# Patient Record
Sex: Male | Born: 2000 | Hispanic: Yes | Marital: Single | Smoking: Never smoker
Health system: Southern US, Community
[De-identification: ages and names within clinical notes are randomized; demographics above are authoritative.]

---
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
## 2020-10-10 ENCOUNTER — Encounter (HOSPITAL_BASED_OUTPATIENT_CLINIC_OR_DEPARTMENT_OTHER): Payer: Self-pay | Admitting: Emergency Medicine

## 2020-10-10 ENCOUNTER — Emergency Department (HOSPITAL_BASED_OUTPATIENT_CLINIC_OR_DEPARTMENT_OTHER)
Admission: EM | Admit: 2020-10-10 | Discharge: 2020-10-10 | Disposition: A | Discharge status: Home / Self Care | Payer: Medicaid Other | Attending: Emergency Medicine | Admitting: Emergency Medicine

## 2020-10-10 ENCOUNTER — Other Ambulatory Visit: Payer: Self-pay

## 2020-10-10 ENCOUNTER — Emergency Department (HOSPITAL_BASED_OUTPATIENT_CLINIC_OR_DEPARTMENT_OTHER): Payer: Medicaid Other

## 2020-10-10 DIAGNOSIS — R0602 Shortness of breath: Secondary | ICD-10-CM | POA: Diagnosis not present

## 2020-10-10 DIAGNOSIS — R198 Other specified symptoms and signs involving the digestive system and abdomen: Secondary | ICD-10-CM

## 2020-10-10 DIAGNOSIS — R0989 Other specified symptoms and signs involving the circulatory and respiratory systems: Secondary | ICD-10-CM | POA: Diagnosis not present

## 2020-10-10 DIAGNOSIS — F410 Panic disorder [episodic paroxysmal anxiety] without agoraphobia: Secondary | ICD-10-CM

## 2020-10-10 DIAGNOSIS — F419 Anxiety disorder, unspecified: Secondary | ICD-10-CM | POA: Insufficient documentation

## 2020-10-10 LAB — BASIC METABOLIC PANEL
Anion gap: 10 (ref 5–15)
BUN: 14 mg/dL (ref 6–20)
CO2: 24 mmol/L (ref 22–32)
Calcium: 9.4 mg/dL (ref 8.9–10.3)
Chloride: 104 mmol/L (ref 98–111)
Creatinine, Ser: 1.11 mg/dL (ref 0.61–1.24)
GFR, Estimated: >60 mL/min (ref >60)
Glucose, Bld: 110 mg/dL — ABNORMAL HIGH (ref 70–99)
Potassium: 3.8 mmol/L (ref 3.5–5.1)
Sodium: 138 mmol/L (ref 135–145)

## 2020-10-10 LAB — CBC
HCT: 42 % (ref 39.0–52.0)
Hemoglobin: 14.4 g/dL (ref 13.0–17.0)
MCH: 30.8 pg (ref 26.0–34.0)
MCHC: 34.3 g/dL (ref 30.0–36.0)
MCV: 89.9 fL (ref 80.0–100.0)
Platelets: 393 10*3/uL (ref 150–400)
RBC: 4.67 MIL/uL (ref 4.22–5.81)
RDW: 12 % (ref 11.5–15.5)
WBC: 10.5 10*3/uL (ref 4.0–10.5)
nRBC: 0 % (ref 0.0–0.2)

## 2020-10-10 LAB — TSH: TSH: 2.309 u[IU]/mL (ref 0.350–4.500)

## 2020-10-10 MED ORDER — OMEPRAZOLE 20 MG PO CPDR: 20.0000 mg | DELAYED_RELEASE_CAPSULE | Freq: Every day | ORAL | 0 refills | Status: AC

## 2020-10-10 NOTE — Discharge Instructions (Signed)
Take the medications as prescribed in case some of your symptoms are being triggered by acid reflux.  Follow-up with an ENT doctor as we discussed.  Call the office to schedule an appointment.  Follow-up with your primary care doctor

## 2020-10-10 NOTE — ED Triage Notes (Signed)
Pt thought he had COVID 2 weeks ago and tested negative. Pt called EMS for SOB. States since he was sick he hasnt been able to get a full breath. Vitals for EMS 78 HR, 99% room air, 98.2 temp, 162/98, CBG 107. Complains of coughing every time he drinks anything. EMS patient is very anxious. Pt crying in triage. States he feels like its hard to swallow.

## 2020-10-10 NOTE — ED Provider Notes (Signed)
[facility] EMERGENCY DEPARTMENT Provider Note   CSN: [id number] Arrival date & time: 10/10/20  1257     History Chief Complaint  Patient presents with  . Panic Attack    Eric Pitts is a 19 y.o. male.  HPI   Patient presents to the ED for evaluation of trouble with intermittent dyspnea.  Patient states the symptoms have been going on for at least a couple months.  He is having episodes at least daily.  Patient gets a sensation that his throat is starting to close up.  This will make him feel anxious and he becomes short of breath.  He occasionally is coughing when he tries to drink.  Patient states he feels like he started to have a panic attack.  He gets numbness and tingling all over.  Patient has not seen anyone since that started.  Today the episode was very severe.  When EMS arrived he was anxious and crying.  He has feels like the symptoms have resolved at this point.  History reviewed. No pertinent past medical history.  Patient Active Problem List   Diagnosis Date Noted  . Ingrown nail 09/13/2014  . Pain in lower limb 09/13/2014    History reviewed. No pertinent surgical history.     No family history on file.  Social History   Tobacco Use  . Smoking status: Never Smoker  . Smokeless tobacco: Never Used  Substance Use Topics  . Alcohol use: No  . Drug use: No    Home Medications Prior to Admission medications   Medication Sig Start Date End Date Taking? Authorizing Provider  ibuprofen (ADVIL,MOTRIN) 600 MG tablet Take 1 tablet (600 mg total) by mouth every 6 (six) hours as needed. 02/25/18   Renne Crigler, PA-C  omeprazole (PRILOSEC) 20 MG capsule Take 1 capsule (20 mg total) by mouth daily. 10/10/20   Linwood Dibbles, MD    Allergies    Patient has no known allergies.  Review of Systems   Review of Systems  All other systems reviewed and are negative.   Physical Exam Updated Vital Signs BP (!) 143/80 (BP Location: Left Arm)   Pulse (!) 55    Temp 99 F (37.2 C) (Oral)   Resp 14   Ht 1.753 m (5\' 9" )   Wt 79.8 kg   SpO2 100%   BMI 25.99 kg/m   Physical Exam Vitals and nursing note reviewed.  Constitutional:      General: He is not in acute distress.    Appearance: He is well-developed.  HENT:     Head: Normocephalic and atraumatic.     Right Ear: External ear normal.     Left Ear: External ear normal.     Mouth/Throat:     Comments: Tonsillar hypertrophy noted, primarily on the right side, no uvula edema, no oropharyngeal edema Eyes:     General: No scleral icterus.       Right eye: No discharge.        Left eye: No discharge.     Conjunctiva/sclera: Conjunctivae normal.  Neck:     Trachea: No tracheal deviation.  Cardiovascular:     Rate and Rhythm: Normal rate and regular rhythm.  Pulmonary:     Effort: Pulmonary effort is normal. No respiratory distress.     Breath sounds: Normal breath sounds. No stridor. No wheezing or rales.  Abdominal:     General: Bowel sounds are normal. There is no distension.     Palpations: Abdomen is  soft.     Tenderness: There is no abdominal tenderness. There is no guarding or rebound.  Musculoskeletal:        General: No tenderness.     Cervical back: Neck supple.  Skin:    General: Skin is warm and dry.     Findings: No rash.  Neurological:     Mental Status: He is alert.     Cranial Nerves: No cranial nerve deficit (no facial droop, extraocular movements intact, no slurred speech).     Sensory: No sensory deficit.     Motor: No abnormal muscle tone or seizure activity.     Coordination: Coordination normal.     ED Results / Procedures / Treatments   Labs (all labs ordered are listed, but only abnormal results are displayed) Labs Reviewed  BASIC METABOLIC PANEL - Abnormal; Notable for the following components:      Result Value   Glucose, Bld 110 (*)    All other components within normal limits  CBC  TSH    EKG EKG Interpretation  Date/Time:  Friday  October 10 2020 15:52:27 EST Ventricular Rate:  60 PR Interval:  148 QRS Duration: 96 QT Interval:  414 QTC Calculation: 414 R Axis:   57 Text Interpretation: Normal sinus rhythm Normal ECG No old tracing to compare Confirmed by Linwood Dibbles [phone]) on 10/10/2020 4:03:23 PM   Radiology DG Chest 2 View  Result Date: 10/10/2020 CLINICAL DATA:  19 year old male with intermittent shortness of breath. Coughing with p.o. Anxious. EXAM: CHEST - 2 VIEW COMPARISON:  None. FINDINGS: Normal lung volumes. Normal cardiac size and mediastinal contours. Visualized tracheal air column is within normal limits. Both lungs are clear. No pneumothorax or pleural effusion. No osseous abnormality identified. Negative visible bowel gas pattern. IMPRESSION: Negative.  No cardiopulmonary abnormality identified. Electronically Signed   By: Odessa Fleming M.D.   On: 10/10/2020 16:07    Procedures Procedures (including critical care time)  Medications Ordered in ED Medications - No data to display  ED Course  I have reviewed the triage vital signs and the nursing notes.  Pertinent labs & imaging results that were available during my care of the patient were reviewed by me and considered in my medical decision making (see chart for details).  Clinical Course as of Oct 10 1748  Fri Oct 10, 2020  1718 Chest x-ray normal.   [JK]  1718 Laboratory tests normal   [JK]    Clinical Course User Index [JK] Linwood Dibbles, MD   MDM Rules/Calculators/A&P                          Patient presented to the ED for evaluation of a sensation of something stuck in his throat as well as shortness of breath.  Patient does not have any abnormalities notable on exam.  He does have some tonsillar hypertrophy but it is not causing any issues with his breathing at rest.  He does not have stridor.  No difficulty with secretions.  I do not see any abnormalities on exam.  Doubt obstructing mass.  Doubt PE or acute cardiac disease.  Does sound  like the patient had a panic attack and paresthesias associated with hyperventilation.  All the symptoms resolved in the ED.  Patient's work-up is unremarkable.  X-ray is normal.  Labs are normal.  Do think it is reasonable have the patient follow-up with an ENT doctor.  We will start him on antacids as  some of this could be triggered by acid reflux.  Recommend follow-up with a primary care doctor. Final Clinical Impression(s) / ED Diagnoses Final diagnoses:  Anxiety attack  Globus sensation    Rx / DC Orders ED Discharge Orders         Ordered    omeprazole (PRILOSEC) 20 MG capsule  Daily        10/10/20 1749           Linwood Dibbles, MD 10/10/20 1751

## 2022-07-06 IMAGING — CR DG CHEST 2V
2 series · 2 of 2 positions shown · non-contrast
Comparison: None.

CLINICAL DATA: 19-year-old male with intermittent shortness of
breath. Coughing with p.o. Anxious.

EXAM:
CHEST - 2 VIEW

[w chest pa]
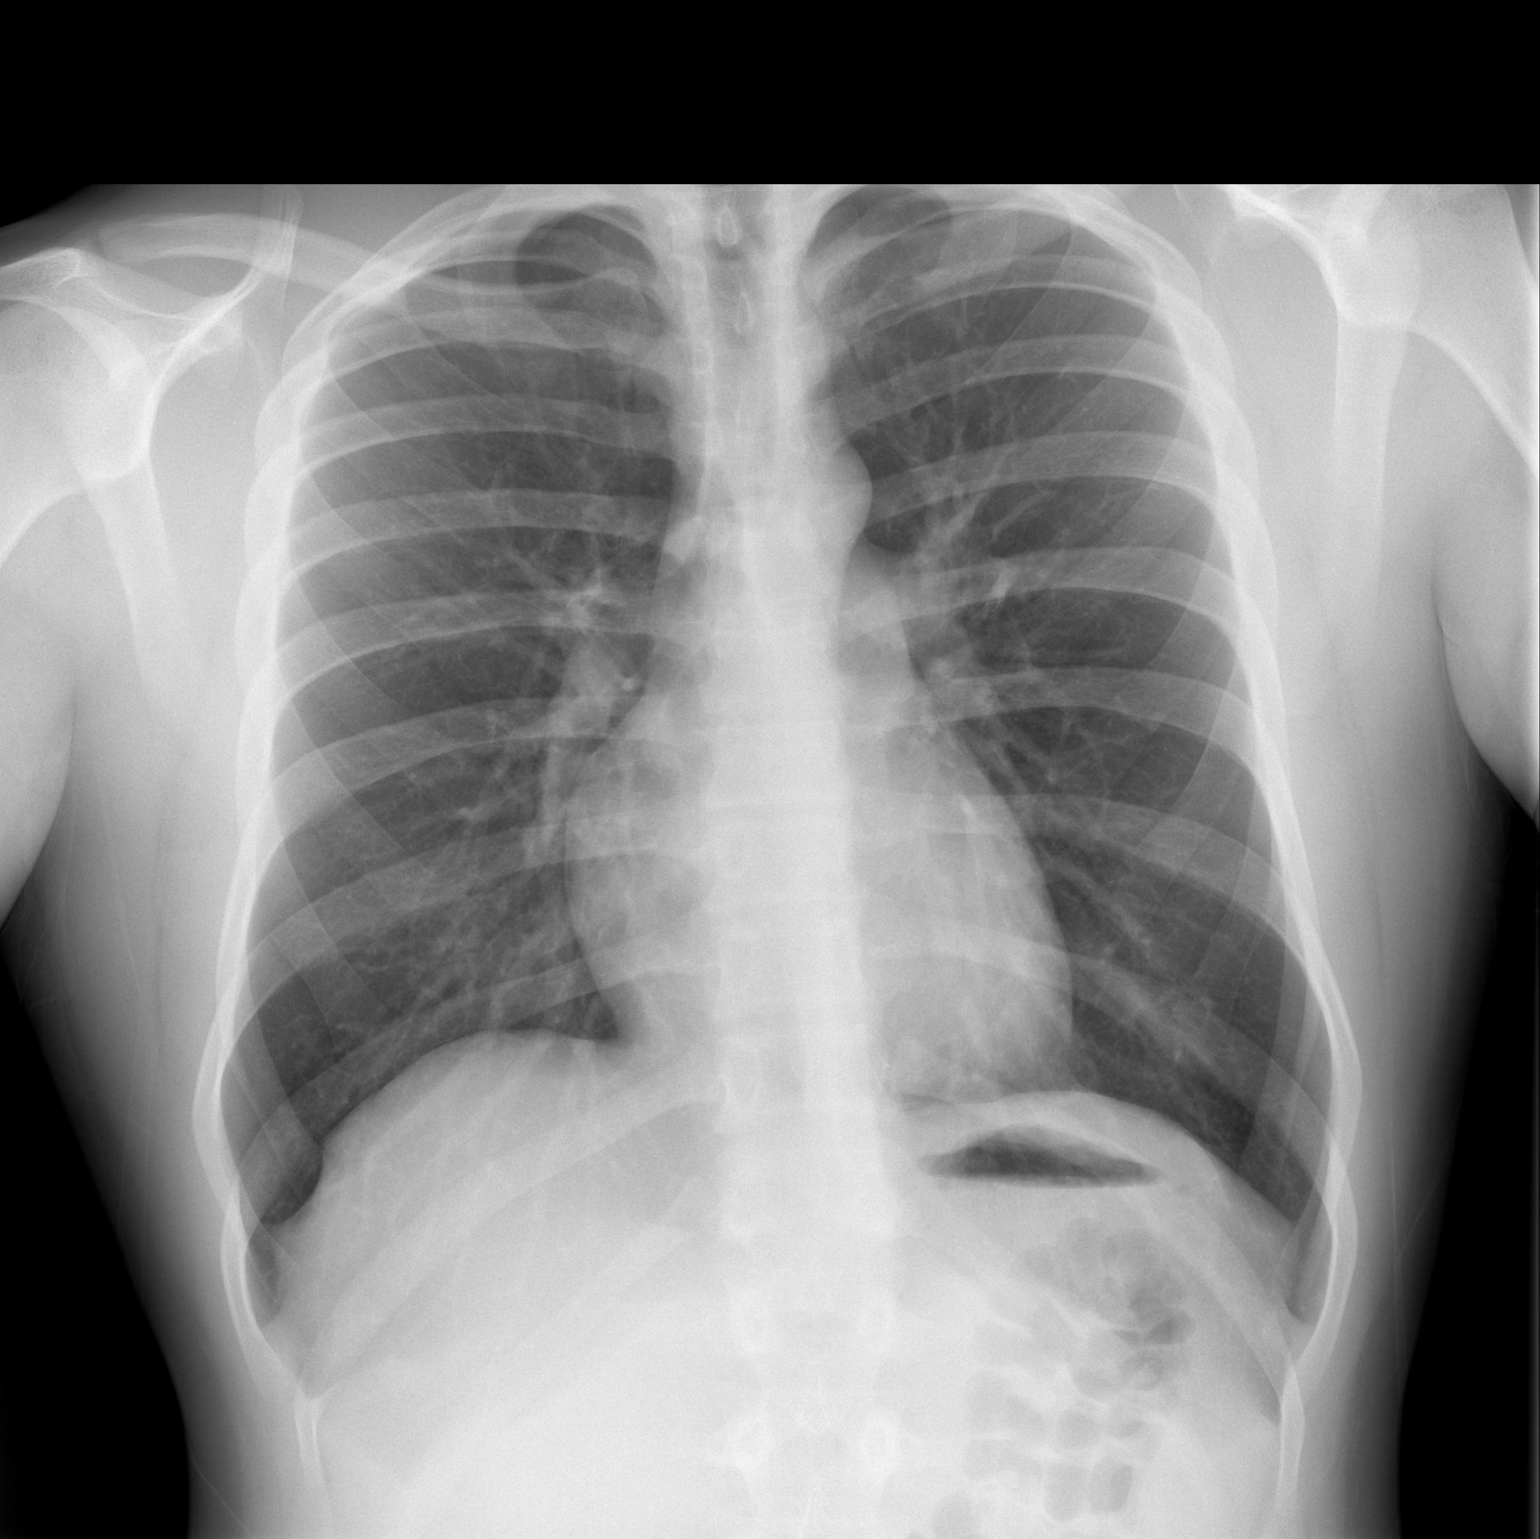

[w chest lat]
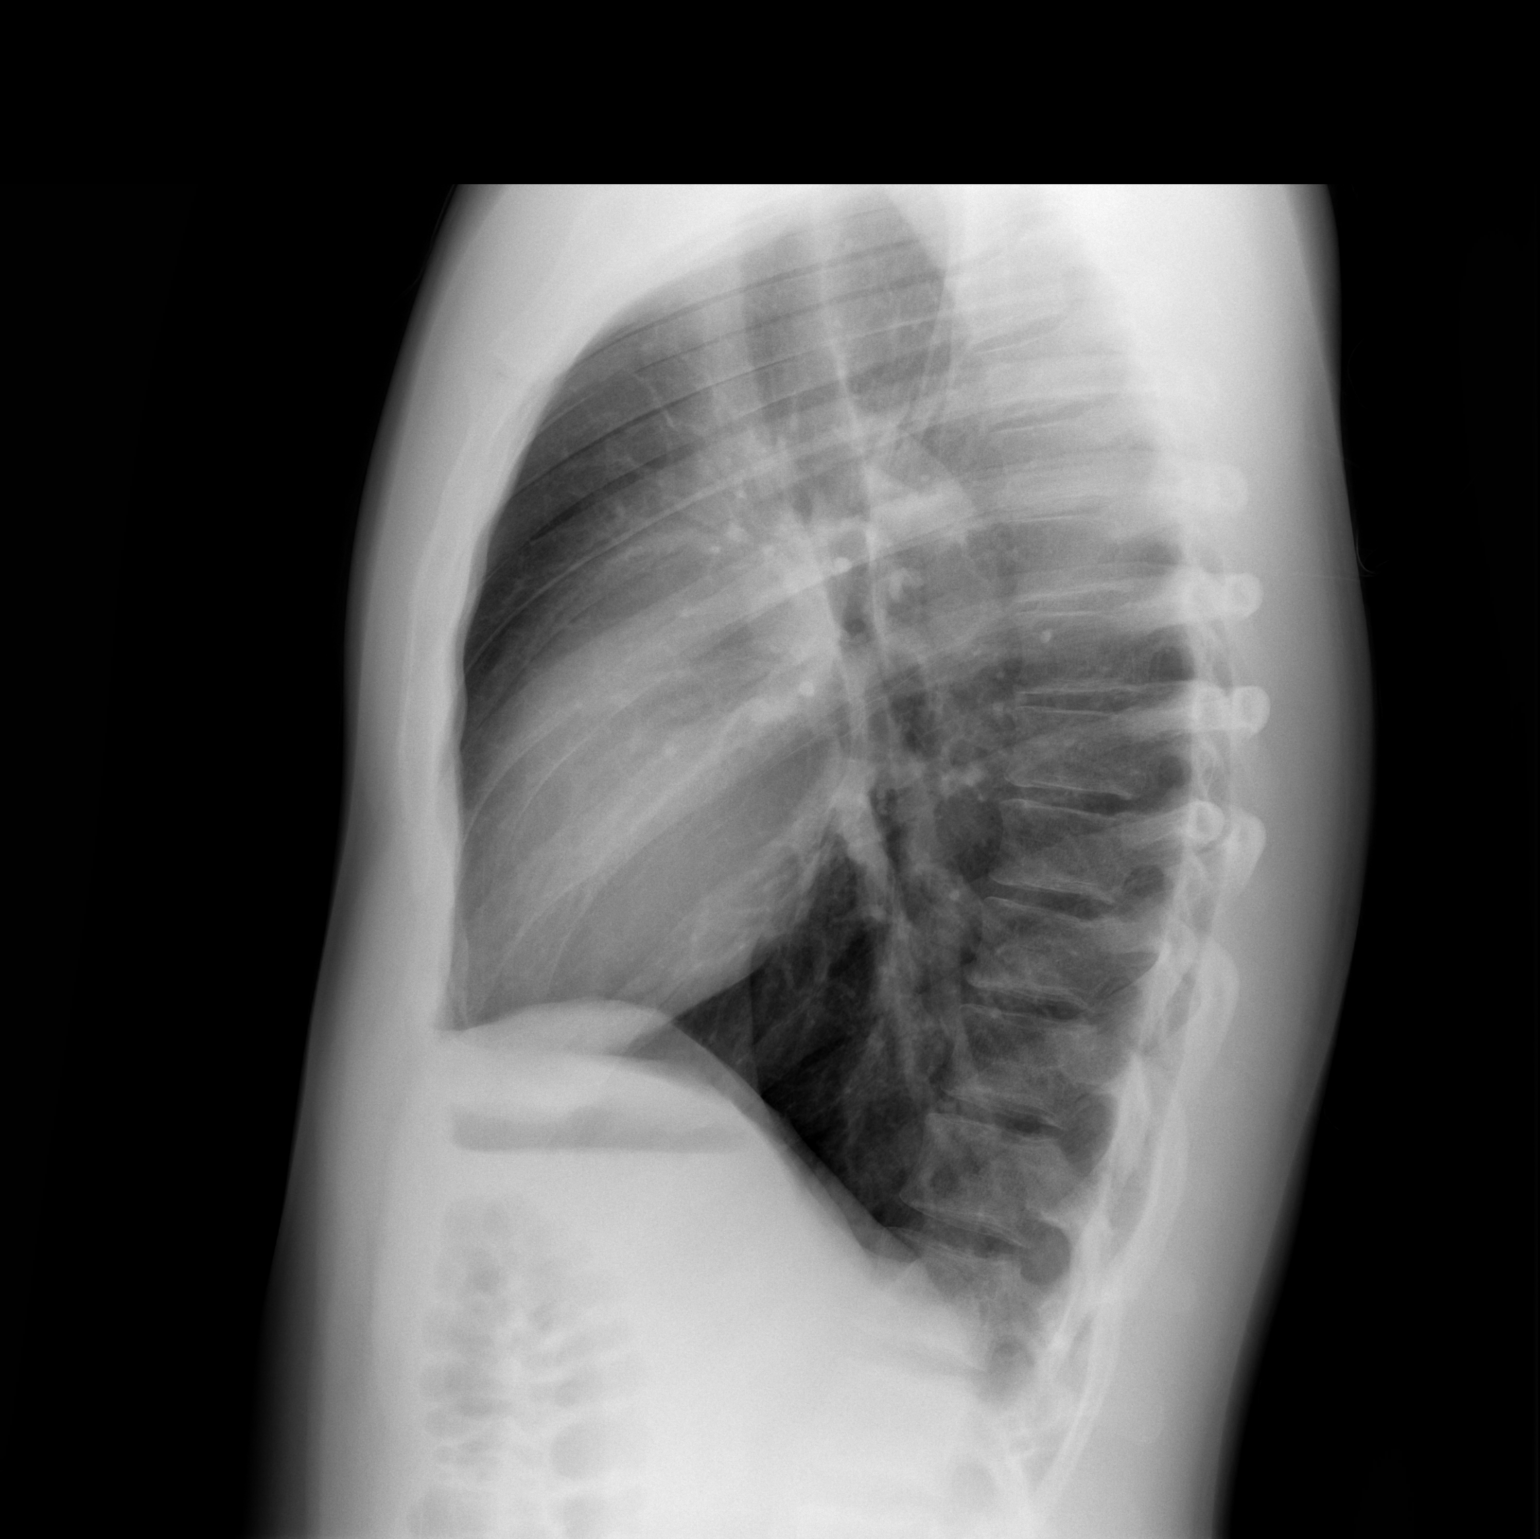

[2 of 2 positions shown; findings below may reference images not displayed]

FINDINGS: Normal lung volumes. Normal cardiac size and mediastinal contours.
Visualized tracheal air column is within normal limits. Both lungs
are clear. No pneumothorax or pleural effusion.

No osseous abnormality identified. Negative visible bowel gas
pattern.
IMPRESSION: Negative.  No cardiopulmonary abnormality identified.

## 2022-11-25 ENCOUNTER — Emergency Department (HOSPITAL_BASED_OUTPATIENT_CLINIC_OR_DEPARTMENT_OTHER)
Admission: EM | Admit: 2022-11-25 | Discharge: 2022-11-25 | Disposition: A | Discharge status: Home / Self Care | Payer: Medicaid Other | Attending: Emergency Medicine | Admitting: Emergency Medicine

## 2022-11-25 ENCOUNTER — Emergency Department (HOSPITAL_COMMUNITY)
Admission: EM | Admit: 2022-11-25 | Discharge: 2022-11-25 | Discharge status: Against Medical Advice | Payer: Medicaid Other

## 2022-11-25 ENCOUNTER — Other Ambulatory Visit: Payer: Self-pay

## 2022-11-25 ENCOUNTER — Encounter (HOSPITAL_BASED_OUTPATIENT_CLINIC_OR_DEPARTMENT_OTHER): Payer: Self-pay | Admitting: Emergency Medicine

## 2022-11-25 DIAGNOSIS — S0990XA Unspecified injury of head, initial encounter: Secondary | ICD-10-CM | POA: Diagnosis not present

## 2022-11-25 DIAGNOSIS — S0003XA Contusion of scalp, initial encounter: Secondary | ICD-10-CM
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
## 2022-11-25 NOTE — Discharge Instructions (Signed)
You may take Ibuprofen and tylenol as needed for pain management. D You may also use ice and heat, and over-the-counter remedies such as Biofreeze gel or salon pas lidocaine patches. The muscle soreness should improve over the next week. Follow up with your family doctor in the next week for a recheck if you are still having symptoms. Return to ED if pain is worsening, you develop weakness or numbness of extremities, or new or concerning symptoms develop.  You were examined today for a head injury and possible concussion.  Sometimes serious problems can develop after a head injury. Please return to the emergency department if you experience any of the following symptoms: Repeated vomiting Headache that gets worse and does not go away Loss of consciousness or inability to stay awake at times when you normally would be able to Getting more confused, restless or agitated Convulsions or seizures Difficulty walking or feeling off balance Weakness or numbness Vision changes A concussion is a very mild traumatic brain injury caused by a bump, jolt or blow to the head, most people recover quickly and fully. You can experience a wide variety of symptoms including:   - Confusion      - Difficulty concentrating       - Trouble remembering new info  - Headache      - Dizziness        - Fuzzy or blurry vision  - Fatigue      - Balance problems      - Light sensitivity  - Mood swings     - Changes in sleep or difficulty sleeping   To help these symptoms improve make sure you are getting plenty of rest, avoid screen time, loud music and strenuous mental activities. Avoid any strenuous physical activities, once your symptoms have resolved a slow and gradual return to activity is recommended. It is very important that you avoid situations in which you might sustain a second head injury as this can be very dangerous and life threatening.

## 2022-11-25 NOTE — ED Notes (Signed)
Pt ambulated without difficulty to room

## 2024-09-24 ENCOUNTER — Encounter (HOSPITAL_BASED_OUTPATIENT_CLINIC_OR_DEPARTMENT_OTHER): Payer: Self-pay

## 2024-09-24 ENCOUNTER — Other Ambulatory Visit: Payer: Self-pay

## 2024-09-24 ENCOUNTER — Emergency Department (HOSPITAL_BASED_OUTPATIENT_CLINIC_OR_DEPARTMENT_OTHER)
Admission: EM | Admit: 2024-09-24 | Discharge: 2024-09-24 | Disposition: A | Discharge status: Home / Self Care | Payer: Self-pay | Attending: Emergency Medicine | Admitting: Emergency Medicine

## 2024-09-24 DIAGNOSIS — J209 Acute bronchitis, unspecified: Secondary | ICD-10-CM | POA: Insufficient documentation

## 2024-09-24 DIAGNOSIS — F17200 Nicotine dependence, unspecified, uncomplicated: Secondary | ICD-10-CM | POA: Insufficient documentation

## 2024-09-24 MED ORDER — DEXAMETHASONE 4 MG PO TABS
10.0000 mg | ORAL_TABLET | Freq: Once | ORAL | Status: AC
  Administered 2024-09-24: 10 mg via ORAL
  Filled 2024-09-24: qty 3

## 2024-09-24 MED ORDER — CETIRIZINE HCL 10 MG PO TABS: 10.0000 mg | ORAL_TABLET | Freq: Every day | ORAL | 0 refills | Status: AC

## 2024-09-24 MED ORDER — ALBUTEROL SULFATE HFA 108 (90 BASE) MCG/ACT IN AERS: 2.0000 | INHALATION_SPRAY | Freq: Four times a day (QID) | RESPIRATORY_TRACT | 0 refills | Status: AC | PRN

## 2024-09-24 MED ORDER — ALBUTEROL SULFATE HFA 108 (90 BASE) MCG/ACT IN AERS
4.0000 | INHALATION_SPRAY | Freq: Once | RESPIRATORY_TRACT | Status: AC
  Administered 2024-09-24: 4 via RESPIRATORY_TRACT
  Filled 2024-09-24: qty 6.7

## 2024-09-24 NOTE — ED Triage Notes (Signed)
 PT self ambulated to exam 15 c/o cough and congestion x 1 month. PT speaking clearly.  Pt denies fever chills and CP.  VSS NAD PT on room air. RT to bedside to assess.

## 2024-09-24 NOTE — ED Provider Notes (Signed)
 [facility] EMERGENCY DEPARTMENT AT [facility] Provider Note   CSN: [id number] Arrival date & time: 09/24/24  9473     Patient presents with: URI   Eric Pitts is a 23 y.o. male.   HPI     This is a 23 year old male who presents with shortness of breath.  Patient reports that he woke up this morning feeling short of breath and wheezy.  He is a current smoker.  He also reports recently moving in with a cat And feels that he may be allergic.  No history of asthma or wheezing in the past.  No fevers but has had some cough.  Cough is nonproductive.  Prior to Admission medications   Medication Sig Start Date End Date Taking? Authorizing Provider  albuterol (VENTOLIN HFA) 108 (90 Base) MCG/ACT inhaler Inhale 2 puffs into the lungs every 6 (six) hours as needed for wheezing or shortness of breath. 09/24/24  Yes Robina Hamor, Charmaine FALCON, MD  cetirizine (ZYRTEC ALLERGY) 10 MG tablet Take 1 tablet (10 mg total) by mouth daily. 09/24/24  Yes Javion Holmer, Charmaine FALCON, MD  ibuprofen  (ADVIL ,MOTRIN ) 600 MG tablet Take 1 tablet (600 mg total) by mouth every 6 (six) hours as needed. 02/25/18   Desiderio Chew, PA-C  omeprazole  (PRILOSEC) 20 MG capsule Take 1 capsule (20 mg total) by mouth daily. 10/10/20   Randol Simmonds, MD    Allergies: Patient has no known allergies.    Review of Systems  Constitutional:  Negative for fever.  Respiratory:  Positive for cough, shortness of breath and wheezing.   Cardiovascular:  Negative for chest pain.  All other systems reviewed and are negative.   Updated Vital Signs BP (!) 131/96 (BP Location: Left Arm)   Pulse (!) 44   Temp 98.3 F (36.8 C) (Oral)   Resp 17   Ht 1.753 m (5' 9)   Wt 79.8 kg   SpO2 94%   BMI 25.98 kg/m   Physical Exam Vitals and nursing note reviewed.  Constitutional:      Appearance: He is well-developed. He is not ill-appearing.  HENT:     Head: Normocephalic and atraumatic.  Eyes:     Pupils: Pupils are equal, round, and reactive  to light.  Cardiovascular:     Rate and Rhythm: Normal rate and regular rhythm.     Heart sounds: Normal heart sounds. No murmur heard. Pulmonary:     Effort: Pulmonary effort is normal. No respiratory distress.     Breath sounds: Wheezing present.     Comments: Fair air movement, expiratory wheeze noted Abdominal:     Palpations: Abdomen is soft.     Tenderness: There is no abdominal tenderness.  Musculoskeletal:     Cervical back: Neck supple.  Lymphadenopathy:     Cervical: No cervical adenopathy.  Skin:    General: Skin is warm and dry.  Neurological:     Mental Status: He is alert and oriented to person, place, and time.  Psychiatric:        Mood and Affect: Mood normal.     (all labs ordered are listed, but only abnormal results are displayed) Labs Reviewed - No data to display  EKG: None  Radiology: No results found.   Procedures   Medications Ordered in the ED  dexamethasone (DECADRON) tablet 10 mg (10 mg Oral Given 09/24/24 0541)  albuterol (VENTOLIN HFA) 108 (90 Base) MCG/ACT inhaler 4 puff (4 puffs Inhalation Given 09/24/24 0541)  Medical Decision Making Risk OTC drugs. Prescription drug management.   This patient presents to the ED for concern of shortness of breath, this involves an extensive number of treatment options, and is a complaint that carries with it a high risk of complications and morbidity.  I considered the following differential and admission for this acute, potentially life threatening condition.  The differential diagnosis includes viral URI, allergy, acute bronchitis, pneumonia  MDM:    This is a 23 year old male who presents with cough, shortness of breath, wheezing.  Nontoxic and vital signs are reassuring.  He is wheezing on exam.  No history of asthma.  However reports recently moving in with a cat and also has had a cough.  May be multifactorial including smoking.  Patient was given albuterol and  Decadron.  On recheck, he is improved.  He only has some mild residual wheezing on the right.  Recommend albuterol at home for symptoms.  Suspect acute bronchitis which may be multifactorial including allergy, smoking, viral.  Will treat supportively.  He is in no respiratory distress and not hypoxic.  Low suspicion for pneumonia or acute bacterial illness.  (Labs, imaging, consults)  Labs: I Ordered, and personally interpreted labs.  The pertinent results include: None  Imaging Studies ordered: I ordered imaging studies including none I independently visualized and interpreted imaging. I agree with the radiologist interpretation  Additional history obtained from chart review.  External records from outside source obtained and reviewed including prior evaluations  Cardiac Monitoring: The patient was maintained on a cardiac monitor.  If on the cardiac monitor, I personally viewed and interpreted the cardiac monitored which showed an underlying rhythm of: Sinus  Reevaluation: After the interventions noted above, I reevaluated the patient and found that they have :improved  Social Determinants of Health:  lives independently  Disposition: Discharge  Co morbidities that complicate the patient evaluation History reviewed. No pertinent past medical history.   Medicines Meds ordered this encounter  Medications   dexamethasone (DECADRON) tablet 10 mg   albuterol (VENTOLIN HFA) 108 (90 Base) MCG/ACT inhaler 4 puff   cetirizine (ZYRTEC ALLERGY) 10 MG tablet    Sig: Take 1 tablet (10 mg total) by mouth daily.    Dispense:  30 tablet    Refill:  0   albuterol (VENTOLIN HFA) 108 (90 Base) MCG/ACT inhaler    Sig: Inhale 2 puffs into the lungs every 6 (six) hours as needed for wheezing or shortness of breath.    Dispense:  1 each    Refill:  0    I have reviewed the patients home medicines and have made adjustments as needed  Problem List / ED Course: Problem List Items Addressed This  Visit   None Visit Diagnoses       Acute bronchitis, unspecified organism    -  Primary                Final diagnoses:  Acute bronchitis, unspecified organism    ED Discharge Orders          Ordered    cetirizine (ZYRTEC ALLERGY) 10 MG tablet  Daily        09/24/24 0613    albuterol (VENTOLIN HFA) 108 (90 Base) MCG/ACT inhaler  Every 6 hours PRN        09/24/24 0613               Bari Charmaine FALCON, MD 09/24/24 [phone]

## 2024-09-24 NOTE — Discharge Instructions (Signed)
 You were seen today for shortness of breath and wheezing.  This is likely related to combination of allergies, smoking, and may be a viral component.  Using inhaler as needed.  You should stop smoking.  Start Zyrtec daily.
